# Patient Record
Sex: Male | Born: 1955 | Race: Black or African American | Hispanic: No | Marital: Single | Smoking: Never smoker
Health system: Southern US, Community
[De-identification: ages and names within clinical notes are randomized; demographics above are authoritative.]

## PROBLEM LIST (undated history)

## (undated) DIAGNOSIS — I10 Essential (primary) hypertension: Secondary | ICD-10-CM

## (undated) HISTORY — PX: BACK SURGERY: SHX140

## (undated) HISTORY — PX: LEG SURGERY: SHX1003

## (undated) HISTORY — DX: Essential (primary) hypertension: I10

---

## 1997-12-12 ENCOUNTER — Ambulatory Visit (HOSPITAL_BASED_OUTPATIENT_CLINIC_OR_DEPARTMENT_OTHER): Admission: RE | Admit: 1997-12-12 | Discharge: 1997-12-12 | Payer: Self-pay | Admitting: Orthopedic Surgery

## 2005-05-17 ENCOUNTER — Ambulatory Visit (HOSPITAL_COMMUNITY)
Admission: RE | Admit: 2005-05-17 | Discharge: 2005-05-17 | Payer: Self-pay | Admitting: Physical Medicine and Rehabilitation

## 2005-07-09 ENCOUNTER — Ambulatory Visit (HOSPITAL_COMMUNITY): Admission: RE | Admit: 2005-07-09 | Discharge: 2005-07-10 | Payer: Self-pay | Admitting: Orthopedic Surgery

## 2007-04-02 IMAGING — CR DG LUMBAR SPINE 2-3V
2 series · 2 of 2 positions shown · non-contrast
Comparison: none

CLINICAL DATA: L4-5 HNP.
 LUMBAR SPINE ? 2 VIEW:

[view not recorded (1 of 2)]
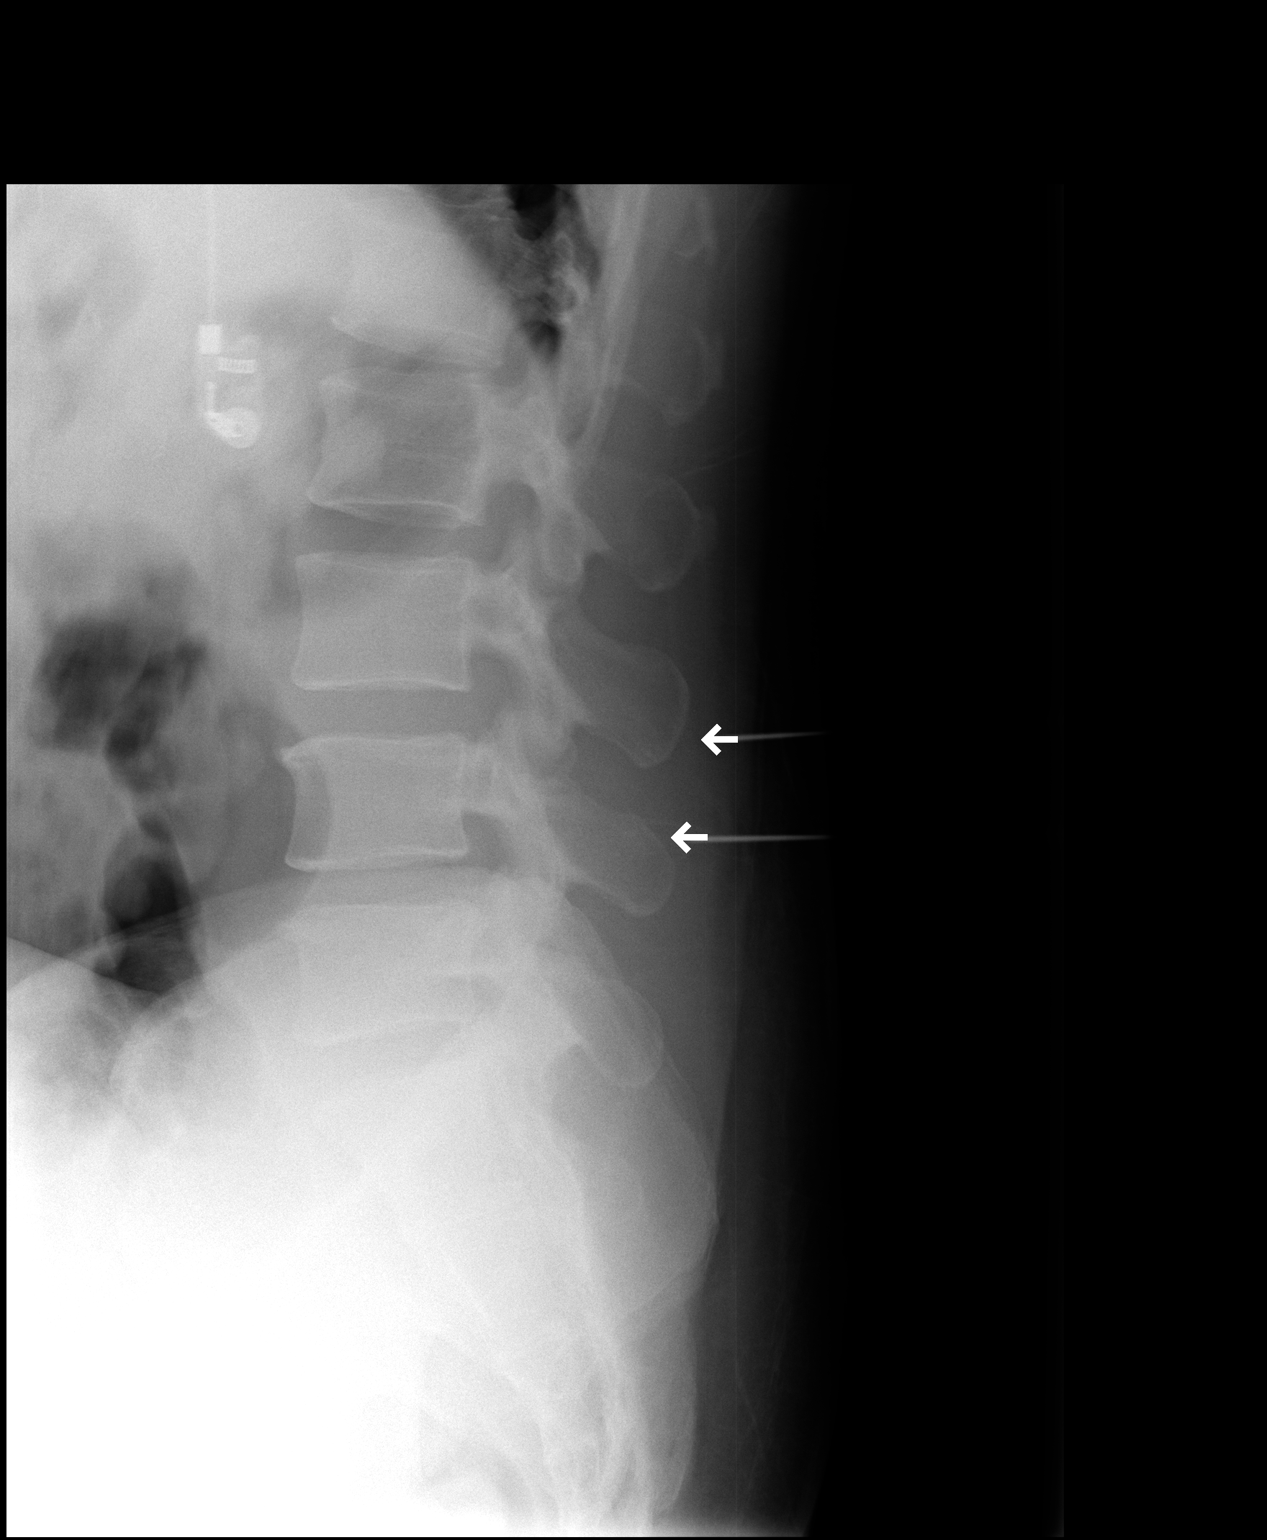

[view not recorded (2 of 2)]
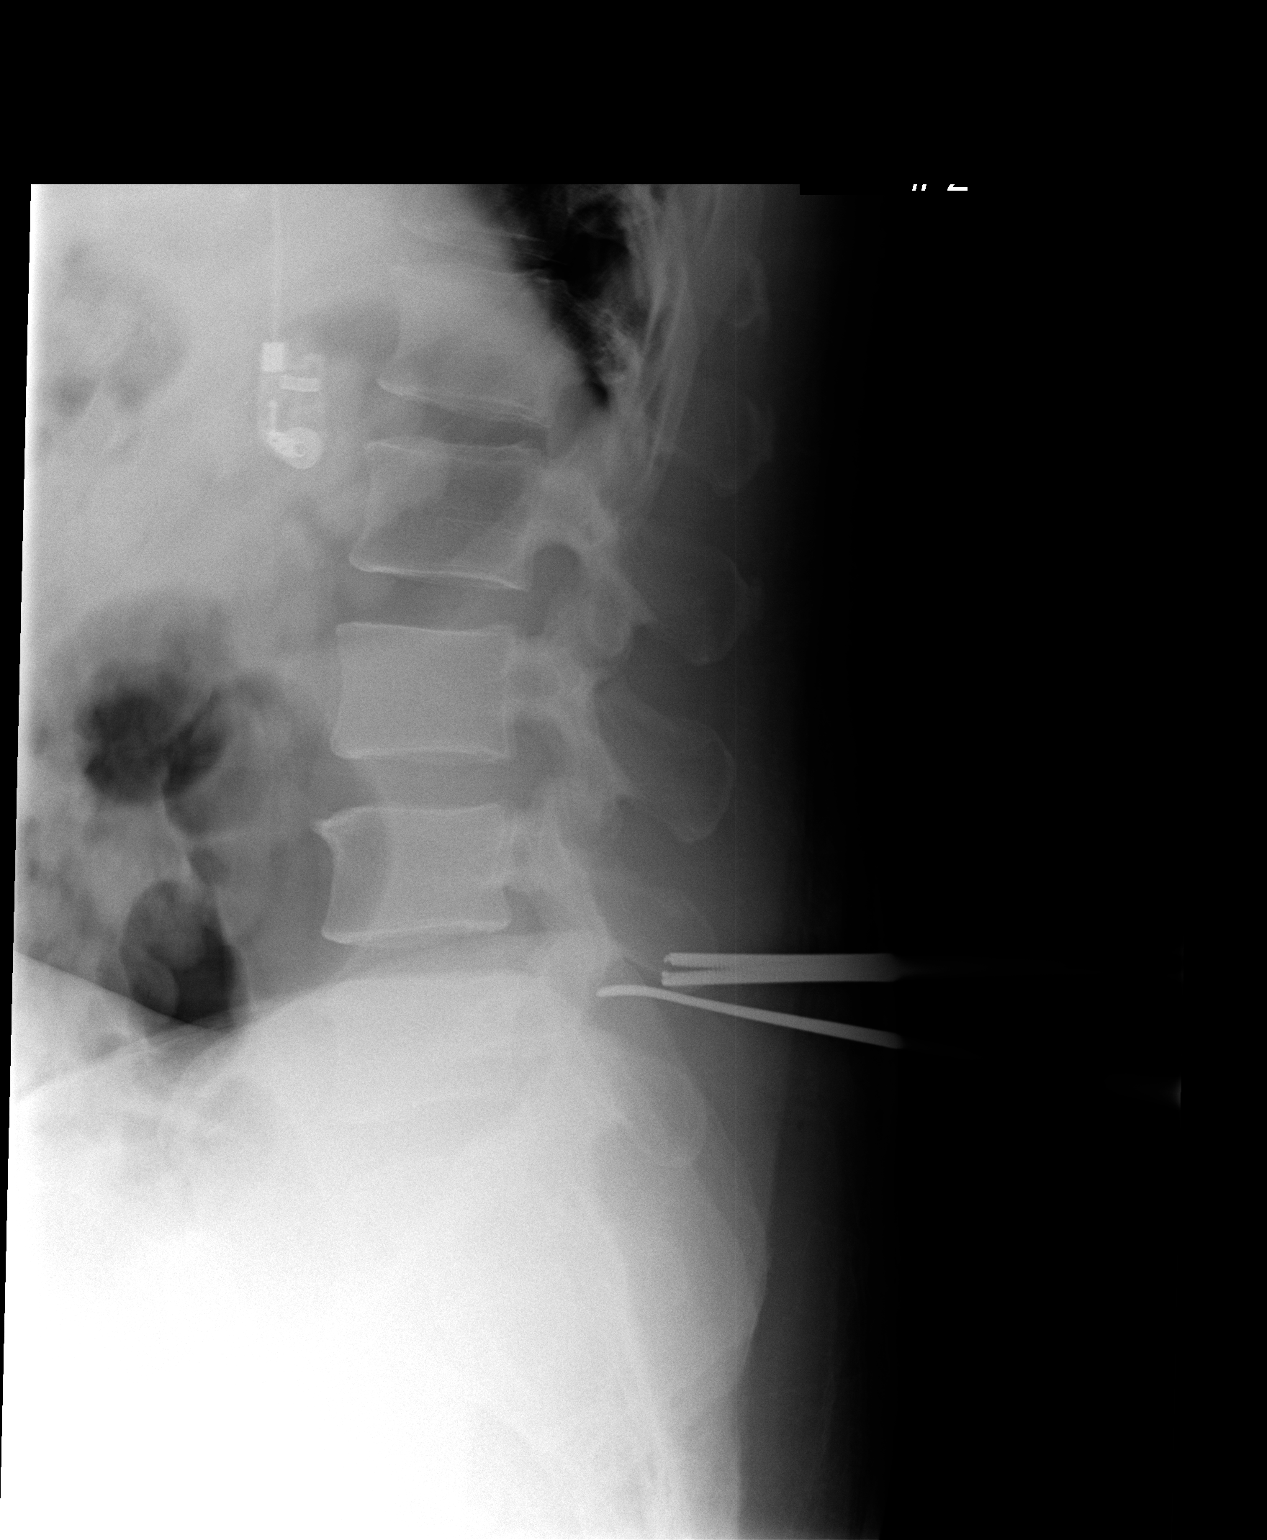

[2 of 2 positions shown; findings below may reference images not displayed]

FINDINGS: Two images obtained in the operating room.  
 Film #1 reveals needles directed at the spinous processes of L3 and L4.
 Film #2 reveals instruments directed towards the L4-5 interspaces with a clamp on the L4 spinous process.
IMPRESSION: L4-5 localized.

## 2009-06-11 ENCOUNTER — Emergency Department (HOSPITAL_COMMUNITY): Admission: EM | Admit: 2009-06-11 | Discharge: 2009-06-11 | Payer: Self-pay | Admitting: Emergency Medicine

## 2011-03-05 IMAGING — CR DG SHOULDER 2+V*R*
3 series · 3 of 3 positions shown · non-contrast
Comparison: None.

CLINICAL DATA: Right shoulder pain

RIGHT SHOULDER - 2+ VIEW

[w shoulder ap internal righ]
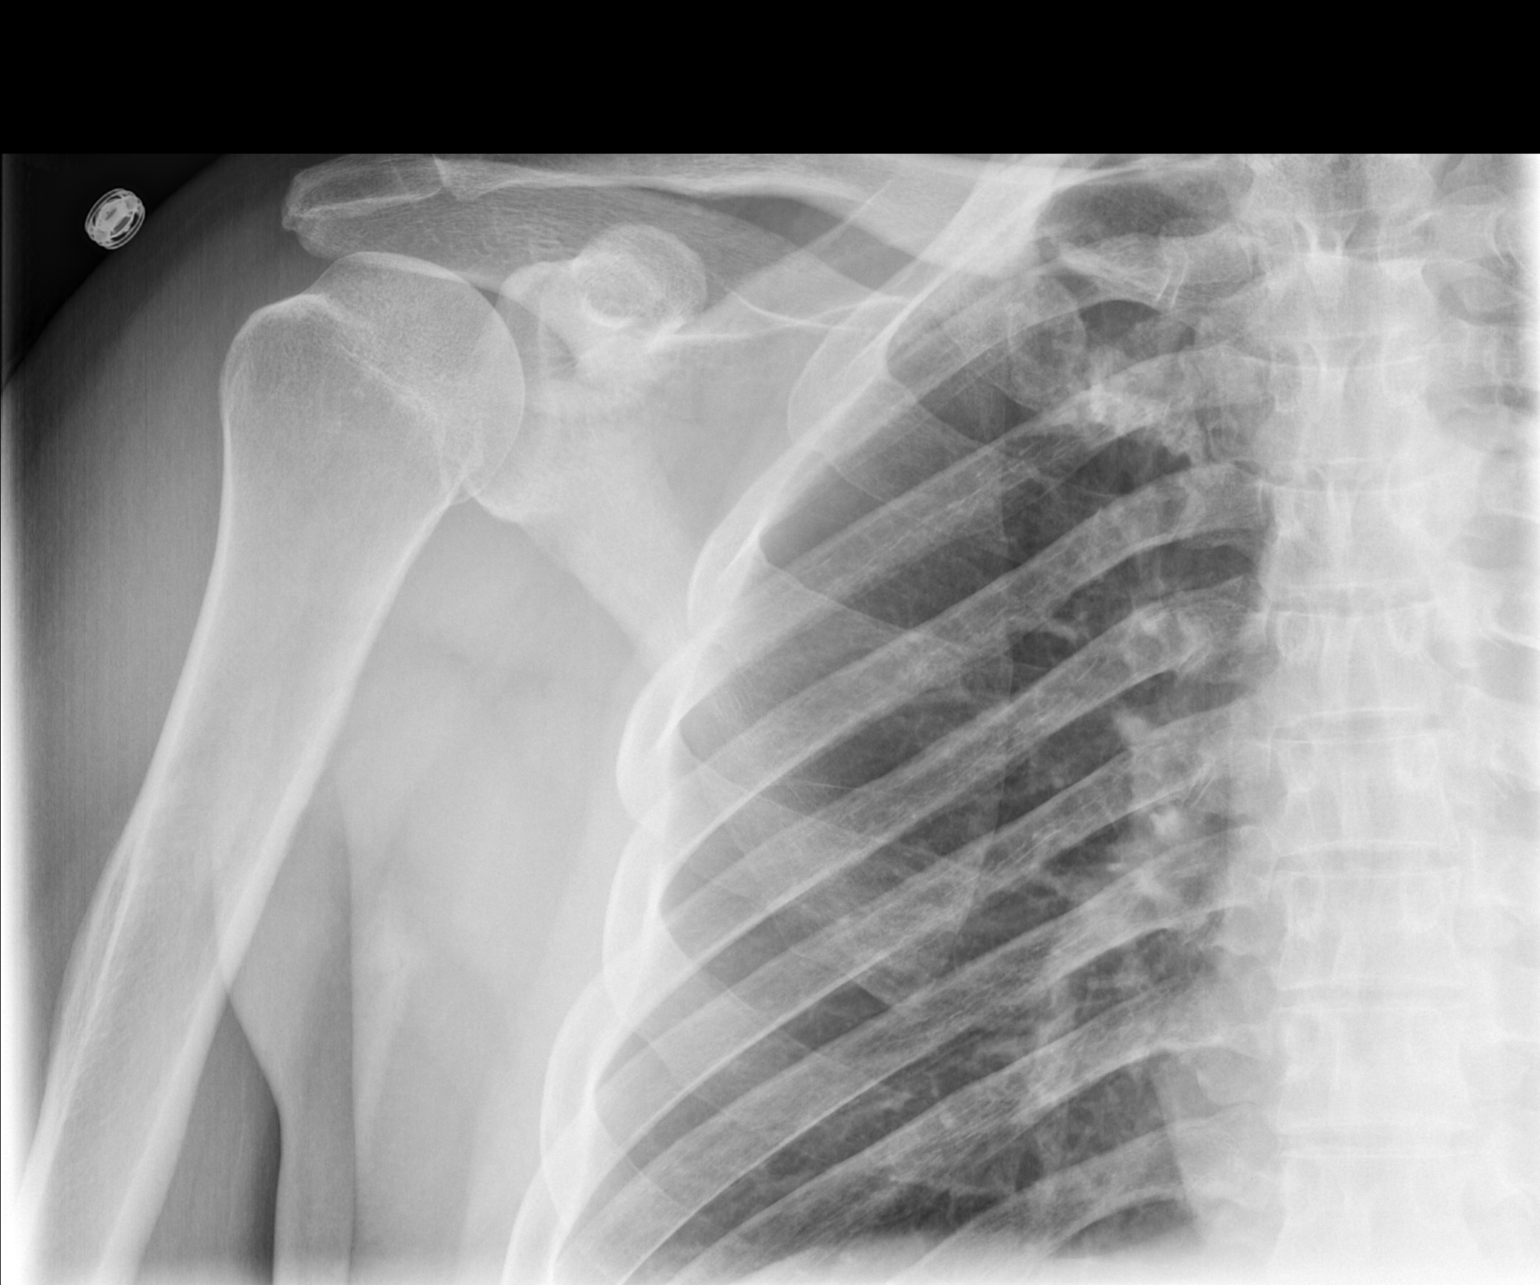

[w shoulder ap external righ]
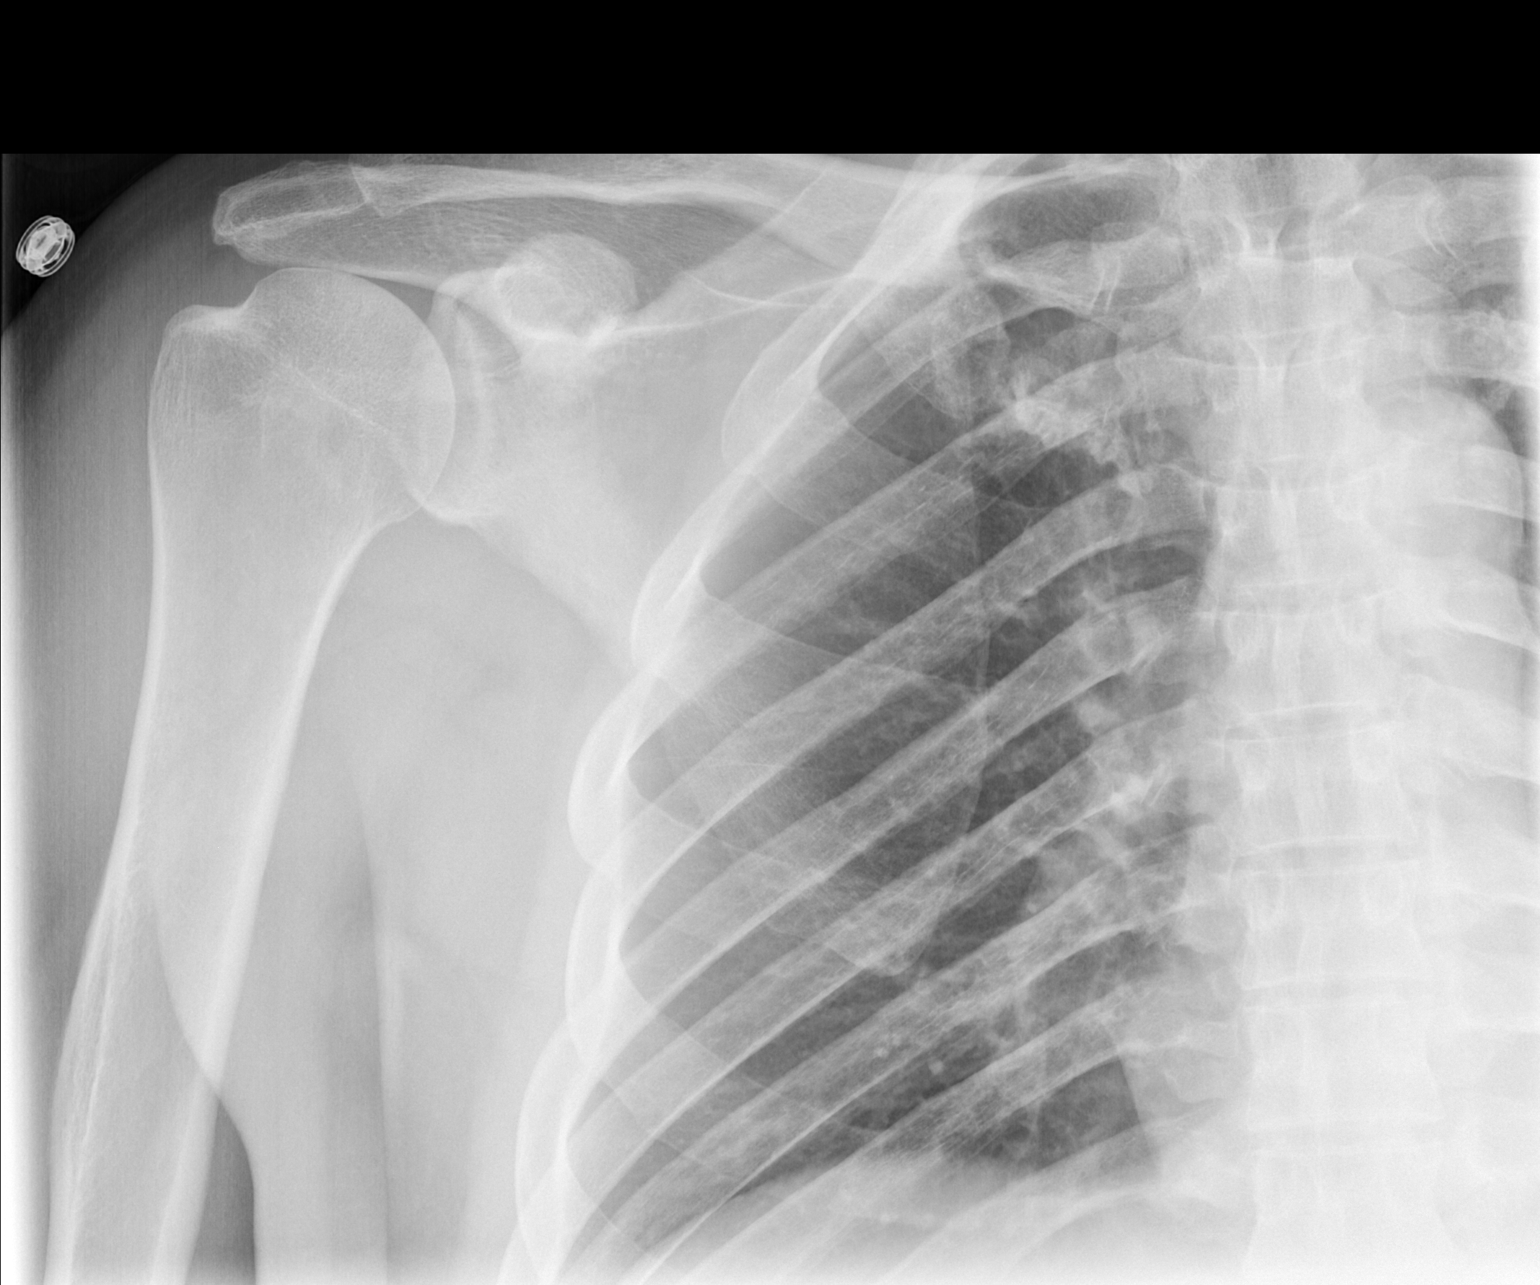

[w shoulder y view right]
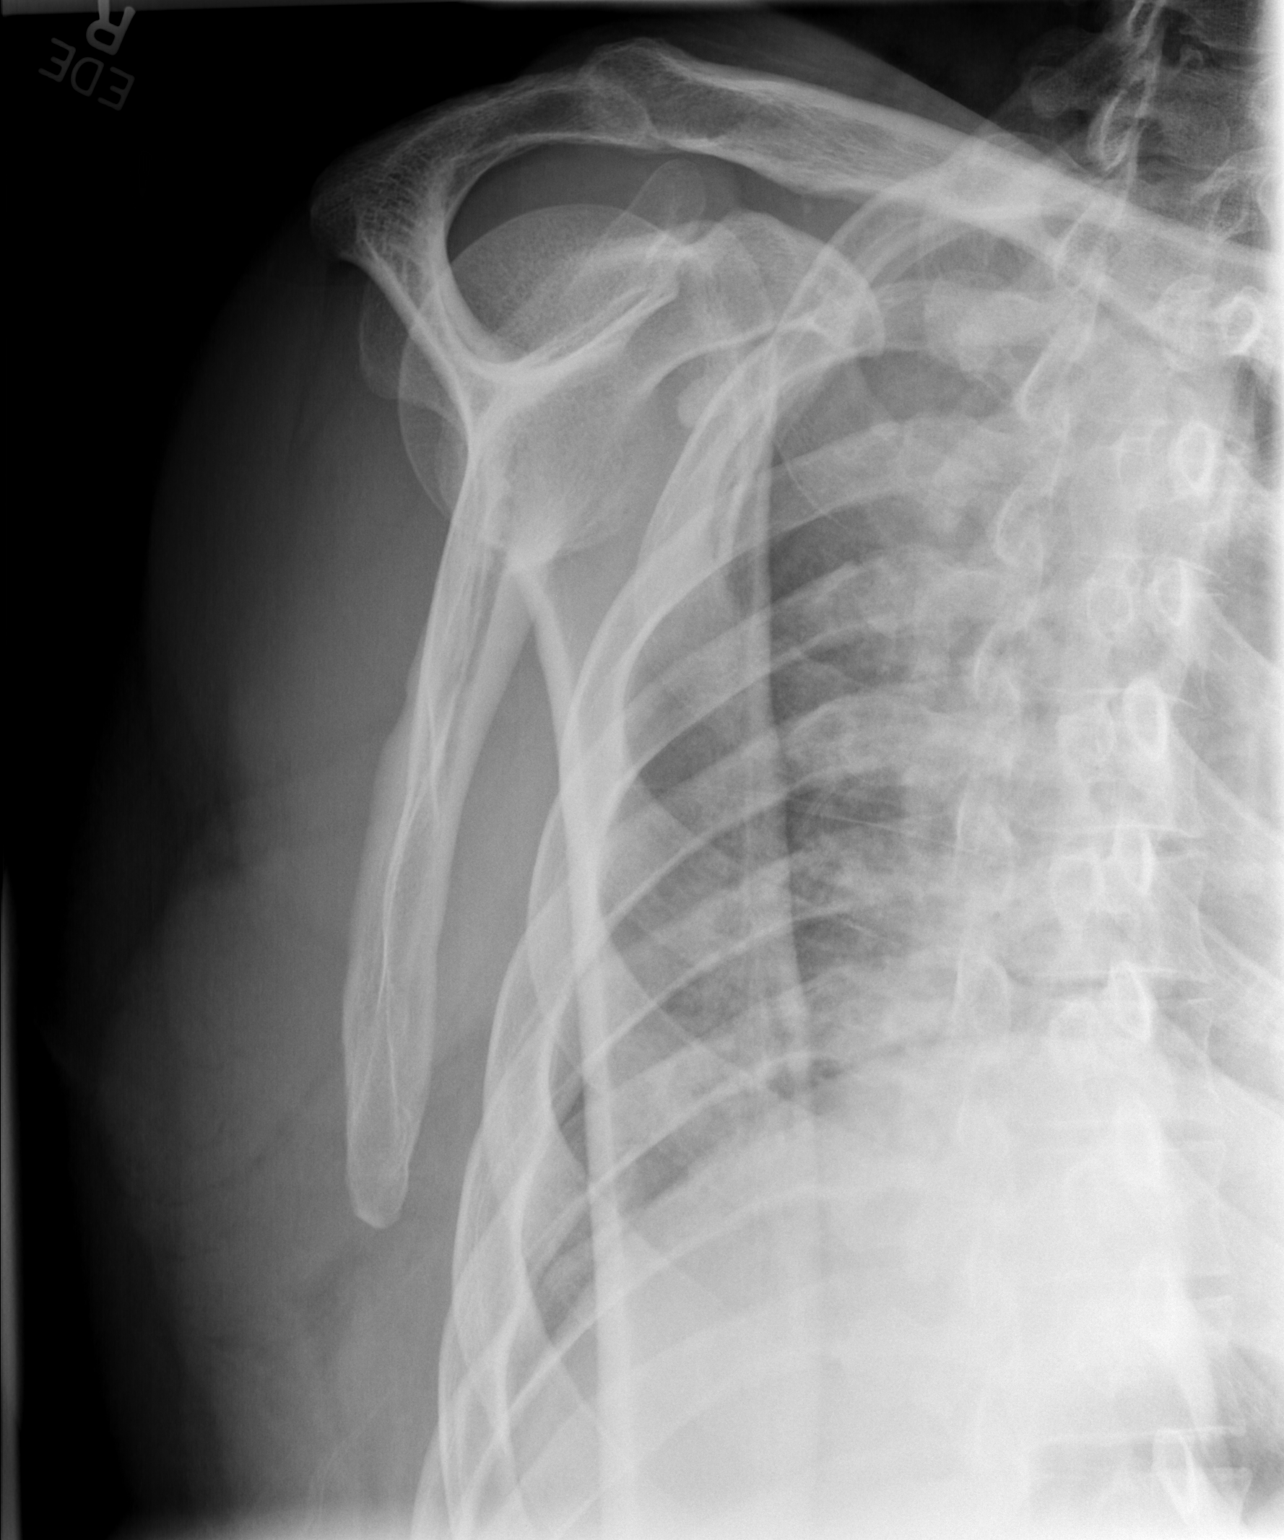

[3 of 3 positions shown; findings below may reference images not displayed]

FINDINGS: Three views of the right shoulder submitted.  No acute
fracture or subluxation.  Minimal degenerative changes right AC
joint with mild inferior spurring of the distal clavicle.
IMPRESSION: No acute fracture or subluxation.  Minimal degenerative changes
right AC joint.

## 2014-07-24 ENCOUNTER — Ambulatory Visit (INDEPENDENT_AMBULATORY_CARE_PROVIDER_SITE_OTHER): Payer: Self-pay | Admitting: Physician Assistant

## 2014-07-24 VITALS — BP 110/72 | HR 87 | Temp 97.9°F | Resp 18 | Ht 71.5 in | Wt 202.0 lb

## 2014-07-24 DIAGNOSIS — Z0289 Encounter for other administrative examinations: Secondary | ICD-10-CM
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
## 2015-04-22 ENCOUNTER — Ambulatory Visit (INDEPENDENT_AMBULATORY_CARE_PROVIDER_SITE_OTHER): Payer: 59 | Admitting: Family Medicine

## 2015-04-22 VITALS — BP 122/72 | HR 74 | Temp 98.6°F | Resp 17 | Ht 71.5 in | Wt 206.0 lb

## 2015-04-22 DIAGNOSIS — R1031 Right lower quadrant pain: Secondary | ICD-10-CM | POA: Diagnosis not present

## 2015-04-22 LAB — POCT CBC
GRANULOCYTE PERCENT: 60.2 % (ref 37–80)
HEMATOCRIT: 42.4 % — AB (ref 43.5–53.7)
HEMOGLOBIN: 14.4 g/dL (ref 14.1–18.1)
LYMPH, POC: 1.5 (ref 0.6–3.4)
MCH, POC: 27.2 pg (ref 27–31.2)
MCHC: 34 g/dL (ref 31.8–35.4)
MCV: 80.2 fL (ref 80–97)
MID (cbc): 0.2 (ref 0–0.9)
MPV: 6.7 fL (ref 0–99.8)
POC GRANULOCYTE: 2.5 (ref 2–6.9)
POC LYMPH PERCENT: 35.2 %L (ref 10–50)
POC MID %: 4.6 %M (ref 0–12)
Platelet Count, POC: 257 10*3/uL (ref 142–424)
RBC: 5.29 M/uL (ref 4.69–6.13)
RDW, POC: 12.7 %
WBC: 4.2 10*3/uL — AB (ref 4.6–10.2)

## 2015-04-22 LAB — COMPLETE METABOLIC PANEL WITH GFR
ALT: 23 U/L (ref 9–46)
AST: 26 U/L (ref 10–35)
Albumin: 3.7 g/dL (ref 3.6–5.1)
Alkaline Phosphatase: 83 U/L (ref 40–115)
BILIRUBIN TOTAL: 0.7 mg/dL (ref 0.2–1.2)
BUN: 19 mg/dL (ref 7–25)
CHLORIDE: 104 mmol/L (ref 98–110)
CO2: 26 mmol/L (ref 20–31)
Calcium: 9.7 mg/dL (ref 8.6–10.3)
Creat: 1.07 mg/dL (ref 0.70–1.33)
GFR, EST NON AFRICAN AMERICAN: 77 mL/min (ref >=60)
GFR, Est African American: 89 mL/min (ref >=60)
Glucose, Bld: 86 mg/dL (ref 65–99)
POTASSIUM: 4.2 mmol/L (ref 3.5–5.3)
SODIUM: 138 mmol/L (ref 135–146)
Total Protein: 7.5 g/dL (ref 6.1–8.1)

## 2015-04-22 LAB — POCT URINALYSIS DIP (MANUAL ENTRY)
Bilirubin, UA: NEGATIVE
GLUCOSE UA: NEGATIVE
Ketones, POC UA: NEGATIVE
Leukocytes, UA: NEGATIVE
NITRITE UA: NEGATIVE
PH UA: 6
Protein Ur, POC: NEGATIVE
RBC UA: NEGATIVE
Spec Grav, UA: 1.02
UROBILINOGEN UA: 1

## 2015-04-22 LAB — POC MICROSCOPIC URINALYSIS (UMFC): Mucus: ABSENT

## 2015-04-22 MED ORDER — IBUPROFEN 200 MG PO TABS
600.0000 mg | ORAL_TABLET | Freq: Once | ORAL | Status: AC
  Administered 2015-04-22: 600 mg via ORAL

## 2015-04-22 NOTE — Patient Instructions (Signed)
Please take ibuprofen over the counter, 2- 3 tablets every 8-12 hours for the next 2-3 days If you have worsening pain, vomiting that won't stop, or any difficulty urinating, please come back in to see [place] or go to the emergency room.

## 2015-04-22 NOTE — Progress Notes (Signed)
Subjective:    Patient ID: Gregory Mills, male    DOB: 05-22-1955, 60 y.o.   MRN: [id number]  HPI This is a pleasant 60 yo male who presents today with 6 days of right sided abdominal pain. Pain radiates to the right side of his back. Pain is 6/10 on presentation. Pain became worse yesterday when the patient pulled on a relief lever that was stuck and suddenly gave way.  Pain occasionally radiates to his right scotum.No pain or difficulty with urination. Patient has not tried any medication/heat/ice. Hot shower did help temporarily. Pain has not interfered with his normal activities or sleep. Patient denies SOB, chest pain, or n/v. Normal bm yesterday, has occasional constipation that he thinks is due to high consumption of nuts. Patient works out several times a week, lifting weights and doing core exercises. He exercised the day before pain started but does not recall doing anything out of the ordinary.    No past medical history on file. Past Surgical History  Procedure Laterality Date  . Leg surgery    . Back surgery     Family History  Problem Relation Age of Onset  . Heart disease Mother   . Stroke Mother   . Dementia Mother   . Hypertension Father   . Heart disease Father    Social History  Substance Use Topics  . Smoking status: Never Smoker   . Smokeless tobacco: Never Used  . Alcohol Use: No     Review of Systems  Constitutional: Negative for fever, chills and fatigue.  Respiratory: Negative for cough, chest tightness and shortness of breath.   Cardiovascular: Negative for chest pain and leg swelling.  Gastrointestinal: Positive for abdominal pain and constipation. Negative for nausea, vomiting, diarrhea, blood in stool and anal bleeding.  Genitourinary: Positive for flank pain (abdominal pain radiates to back) and testicular pain (intermittent, radiates from abdomen to right testicle.). Negative for dysuria, frequency, hematuria and penile pain.  Neurological:  Negative for dizziness and light-headedness.       Objective:   Physical Exam  Constitutional: He is oriented to person, place, and time. He appears well-developed and well-nourished.  HENT:  Head: Normocephalic and atraumatic.  Eyes: Conjunctivae are normal.  Neck: Normal range of motion.  Cardiovascular: Normal rate, regular rhythm and normal heart sounds.   Pulmonary/Chest: Effort normal and breath sounds normal.  Abdominal: Soft. Bowel sounds are normal. He exhibits no distension and no mass. There is tenderness in the right upper quadrant and right lower quadrant. There is CVA tenderness. There is no rigidity, no rebound, no guarding, no tenderness at McBurney's point and negative Murphy's sign. No hernia.  Pain is reproduced with abdominal flexion and lateral twisting to the left.   Genitourinary: Penis normal. Right testis shows tenderness (mildly tender, generalized). Right testis shows no mass and no swelling. Right testis is descended. Left testis shows no mass, no swelling and no tenderness. Left testis is descended.  Musculoskeletal: Normal range of motion.  Neurological: He is alert and oriented to person, place, and time.  Skin: Skin is warm.  Psychiatric: He has a normal mood and affect. His behavior is normal. Judgment and thought content normal.  Vitals reviewed.   BP 122/72 mmHg  Pulse 74  Temp(Src) 98.6 F (37 C) (Oral)  Resp 17  Ht 5' 11.5" (1.816 m)  Wt 206 lb (93.441 kg)  BMI 28.33 kg/m2  SpO2 96% Wt Readings from Last 3 Encounters:  04/22/15 206 lb (93.441 kg)  07/24/14 202 lb (91.627 kg)       Assessment & Plan:  1. Right lower quadrant abdominal pain - POCT CBC- unremarkable, discussed results with patient - COMPLETE METABOLIC PANEL WITH GFR - POCT urinalysis dipstick- unremarkable - POCT Microscopic Urinalysis ([facility])- umremarkable - ibuprofen (ADVIL,MOTRIN) tablet 600 mg; Take 3 tablets (600 mg total) by mouth once. - suspect muscle strain with  unremarkable POCT labs -  Patient Instructions  Please take ibuprofen over the counter, 2- 3 tablets every 8-12 hours for the next 2-3 days If you have worsening pain, vomiting that won't stop, or any difficulty urinating, please come back in to see [place] or go to the emergency room.    - Will follow up with him tomorrow with CMP results - CPE/ establish care  Olean Ree, FNP-BC  [facility]  04/23/2015 8:07 PM

## 2015-04-23 ENCOUNTER — Encounter: Payer: Self-pay | Admitting: Family Medicine

## 2015-04-25 ENCOUNTER — Telehealth: Payer: Self-pay | Admitting: Family Medicine

## 2015-04-25 NOTE — Telephone Encounter (Signed)
lmom for pt to call to set up an CPE with Debbie in 1-2 months

## 2015-04-28 ENCOUNTER — Telehealth: Payer: Self-pay

## 2015-04-28 NOTE — Telephone Encounter (Signed)
LMVM for patient to cb to schedule an appt with Deboraha Sprang.

## 2015-04-28 NOTE — Telephone Encounter (Signed)
-----   Message from Emi Belfast, FNP sent at 04/23/2015  7:40 PM EST ----- Please call patient to schedule him for CPE in 1-2 months.  Thank you,  Eunice Blase

## 2015-05-01 ENCOUNTER — Telehealth: Payer: Self-pay | Admitting: Family Medicine

## 2015-05-01 NOTE — Telephone Encounter (Signed)
-----   Message from Emi Belfast, FNP sent at 04/23/2015  7:40 PM EST ----- Please call patient to schedule him for CPE in 1-2 months.  Thank you,  Eunice Blase

## 2015-05-01 NOTE — Telephone Encounter (Signed)
LMOM to CB. 

## 2015-08-12 ENCOUNTER — Telehealth: Payer: Self-pay | Admitting: Cardiovascular Disease

## 2015-08-12 NOTE — Telephone Encounter (Signed)
[facility] appointment with Dr Allyson SabalBerry on 08/27/15.  Records given to [employer] (medical records) for Dr Hazle CocaBerry's schedule on 08/27/15. lp

## 2015-08-27 ENCOUNTER — Encounter: Payer: Self-pay | Admitting: Cardiovascular Disease

## 2015-08-27 ENCOUNTER — Other Ambulatory Visit: Payer: Self-pay | Admitting: *Deleted

## 2015-08-27 ENCOUNTER — Ambulatory Visit (INDEPENDENT_AMBULATORY_CARE_PROVIDER_SITE_OTHER): Payer: Commercial Managed Care - HMO | Admitting: Cardiovascular Disease

## 2015-08-27 VITALS — BP 108/76 | HR 67 | Ht 73.0 in | Wt 207.0 lb

## 2015-08-27 DIAGNOSIS — E785 Hyperlipidemia, unspecified: Secondary | ICD-10-CM | POA: Diagnosis not present

## 2015-08-27 DIAGNOSIS — R079 Chest pain, unspecified: Secondary | ICD-10-CM

## 2015-08-27 DIAGNOSIS — Z01818 Encounter for other preprocedural examination: Secondary | ICD-10-CM | POA: Diagnosis not present

## 2015-08-27 DIAGNOSIS — Z79899 Other long term (current) drug therapy: Secondary | ICD-10-CM | POA: Diagnosis not present

## 2015-08-27 DIAGNOSIS — I10 Essential (primary) hypertension: Secondary | ICD-10-CM

## 2015-08-27 HISTORY — DX: Essential (primary) hypertension: I10

## 2015-08-27 LAB — CBC WITH DIFFERENTIAL/PLATELET
BASOS ABS: 44 {cells}/uL (ref 0–200)
Basophils Relative: 1 %
EOS ABS: 44 {cells}/uL (ref 15–500)
Eosinophils Relative: 1 %
HEMATOCRIT: 42.9 % (ref 38.5–50.0)
HEMOGLOBIN: 14.3 g/dL (ref 13.2–17.1)
LYMPHS ABS: 1716 {cells}/uL (ref 850–3900)
LYMPHS PCT: 39 %
MCH: 26.7 pg — AB (ref 27.0–33.0)
MCHC: 33.3 g/dL (ref 32.0–36.0)
MCV: 80.2 fL (ref 80.0–100.0)
MONO ABS: 220 {cells}/uL (ref 200–950)
MPV: 9.9 fL (ref 7.5–12.5)
Monocytes Relative: 5 %
NEUTROS PCT: 54 %
Neutro Abs: 2376 cells/uL (ref 1500–7800)
Platelets: 219 10*3/uL (ref 140–400)
RBC: 5.35 MIL/uL (ref 4.20–5.80)
RDW: 14 % (ref 11.0–15.0)
WBC: 4.4 10*3/uL (ref 3.8–10.8)

## 2015-08-27 LAB — BASIC METABOLIC PANEL
BUN: 16 mg/dL (ref 7–25)
CO2: 22 mmol/L (ref 20–31)
CREATININE: 1.15 mg/dL (ref 0.70–1.33)
Calcium: 9.3 mg/dL (ref 8.6–10.3)
Chloride: 106 mmol/L (ref 98–110)
GLUCOSE: 90 mg/dL (ref 65–99)
POTASSIUM: 4.7 mmol/L (ref 3.5–5.3)
Sodium: 141 mmol/L (ref 135–146)

## 2015-08-27 LAB — TSH: TSH: 1.09 mIU/L (ref 0.40–4.50)

## 2015-08-27 MED ORDER — LOSARTAN POTASSIUM 50 MG PO TABS: 50.0000 mg | ORAL_TABLET | Freq: Every day | ORAL | Status: DC

## 2015-08-27 NOTE — Progress Notes (Signed)
08/27/2015 Gregory Mills   1955-12-15  [id number]  Primary Physician No PCP Per Patient Primary Cardiologist: Runell Gess MD Roseanne Reno   HPI:  Gregory Mills there is a 60 year old thin appearing engaged African-American male with no children and works as a Agricultural consultant. He was referred by Dr. Hanley Hays for cardiac catheterization. History of factors include hyperlipidemia and family history. He is complaining of increasing dyspnea on exertion and chest pain for the last year and had a recent Myoview stress test notable for inferior ischemia. He was referred here for cardiac catheterization.   Current Outpatient Prescriptions  Medication Sig Dispense Refill  . aspirin 81 MG tablet Take 81 mg by mouth daily.    . Cholecalciferol (VITAMIN D3) 2000 units TABS Take 2,000 Units by mouth.    . doxycycline (VIBRA-TABS) 100 MG tablet Take 100 mg by mouth 2 (two) times daily.    Marland Kitchen ketoconazole (NIZORAL) 2 % cream     . Multiple Vitamin (MULTIVITAMIN) tablet Take 1 tablet by mouth daily.    . nitroGLYCERIN (NITROSTAT) 0.4 MG SL tablet     . rosuvastatin (CRESTOR) 5 MG tablet Take 5 mg by mouth daily.    Marland Kitchen losartan (COZAAR) 50 MG tablet Take 1 tablet (50 mg total) by mouth daily. 90 tablet 3   No current facility-administered medications for this visit.    No Known Allergies  Social History   Social History  . Marital Status: Single    Spouse Name: N/A  . Number of Children: N/A  . Years of Education: N/A   Occupational History  . Not on file.   Social History Main Topics  . Smoking status: Never Smoker   . Smokeless tobacco: Never Used  . Alcohol Use: No  . Drug Use: No  . Sexual Activity: Not on file   Other Topics Concern  . Not on file   Social History Narrative     Review of Systems: General: negative for chills, fever, night sweats or weight changes.  Cardiovascular: negative for chest pain, dyspnea on exertion, edema, orthopnea,  palpitations, paroxysmal nocturnal dyspnea or shortness of breath Dermatological: negative for rash Respiratory: negative for cough or wheezing Urologic: negative for hematuria Abdominal: negative for nausea, vomiting, diarrhea, bright red blood per rectum, melena, or hematemesis Neurologic: negative for visual changes, syncope, or dizziness All other systems reviewed and are otherwise negative except as noted above.    Blood pressure 108/76, pulse 67, height  (1.854 m), weight 207 lb (93.895 kg).  General appearance: alert and no distress Neck: no adenopathy, no carotid bruit, no JVD, supple, symmetrical, trachea midline and thyroid not enlarged, symmetric, no tenderness/mass/nodules Lungs: clear to auscultation bilaterally Heart: regular rate and rhythm, S1, S2 normal, no murmur, click, rub or gallop Extremities: extremities normal, atraumatic, no cyanosis or edema  EKG normal sinus rhythm at 67 with lateral T-wave inversion. I personally reviewed this EKG  ASSESSMENT AND PLAN:   Essential hypertension History of hypertensive blood pressure measured at 108/76. He is on low-dose lisinopril and has a dry cough. I'm going to change him to losartan 50 mg a day  Hyperlipidemia History of hyperlipidemia on statin therapy followed by his PCP  Chest pain History of exertional chest pain and dyspnea with a positive Myoview stress test consistent with inferior ischemia. The patient was referred for cardiac catheterization. He does have a sector including family history, hypertension and hyperlipidemia.  I have reviewed the risks, indications, and alternatives  to cardiac catheterization, possible angioplasty, and stenting with the patient. Risks include but are not limited to bleeding, infection, vascular injury, stroke, myocardial infection, arrhythmia, kidney injury, radiation-related injury in the case of prolonged fluoroscopy use, emergency cardiac surgery, and death. The patient  understands the risks of serious complication is 1-2 in 1000 with diagnostic cardiac cath and 1-2% or less with angioplasty/stenting.       Runell GessJonathan J. Grayton Lobo MD FACP,FACC,FAHA, [facility] 08/27/2015 11:15 AM

## 2015-08-27 NOTE — Patient Instructions (Signed)
Medication Instructions:  Your physician has recommended you make the following change in your medication:  1- STOP TAKING YOUR LISINOPRIL 2- START TAKING Losartan 50 mg (1 tablet) by mouth daily.   Testing/Procedures: Your physician has requested that you have a cardiac catheterization. Cardiac catheterization is used to diagnose and/or treat various heart conditions. Doctors may recommend this procedure for a number of different reasons. The most common reason is to evaluate chest pain. Chest pain can be a symptom of coronary artery disease (CAD), and cardiac catheterization can show whether plaque is narrowing or blocking your heart's arteries. This procedure is also used to evaluate the valves, as well as measure the blood flow and oxygen levels in different parts of your heart. For further information please visit [email or web address].   Following your catheterization, you will not be allowed to drive for 3 days.  No lifting, pushing, or pulling greater that 10 pounds is allowed for 1 week.  You will be required to have the following tests prior to the procedure:  1. Blood work-the blood work can be done no more than 14 days prior to the procedure.  [facility] be done at [facility].  There is one downstairs on the first floor of this building and one in the [facility] building [phone]([address], [address]).  2. Chest Xray-the chest [facility].      Puncture site RIGHT WRIST   Any Other Special Instructions Will Be Listed Below (If Applicable).     If you need a refill on your cardiac medications before your next appointment, please call your pharmacy.

## 2015-08-27 NOTE — Assessment & Plan Note (Signed)
History of hyperlipidemia on statin therapy followed by his PCP 

## 2015-08-27 NOTE — Assessment & Plan Note (Signed)
History of exertional chest pain and dyspnea with a positive Myoview stress test consistent with inferior ischemia. The patient was referred for cardiac catheterization. He does have a sector including family history, hypertension and hyperlipidemia.  I have reviewed the risks, indications, and alternatives to cardiac catheterization, possible angioplasty, and stenting with the patient. Risks include but are not limited to bleeding, infection, vascular injury, stroke, myocardial infection, arrhythmia, kidney injury, radiation-related injury in the case of prolonged fluoroscopy use, emergency cardiac surgery, and death. The patient understands the risks of serious complication is 1-2 in 1000 with diagnostic cardiac cath and 1-2% or less with angioplasty/stenting.

## 2015-08-27 NOTE — Assessment & Plan Note (Signed)
History of hypertensive blood pressure measured at 108/76. He is on low-dose lisinopril and has a dry cough. I'm going to change him to losartan 50 mg a day

## 2015-08-28 ENCOUNTER — Telehealth: Payer: Self-pay | Admitting: Cardiovascular Disease

## 2015-08-28 LAB — PROTIME-INR
INR: 1.01 (ref <1.50)
Prothrombin Time: 13.4 seconds (ref 11.6–15.2)

## 2015-08-28 LAB — APTT: APTT: 30 s (ref 24–37)

## 2015-08-28 NOTE — Telephone Encounter (Signed)
New message    Patient calling asking for sooner procedure appt than 6/19

## 2015-08-28 NOTE — Telephone Encounter (Signed)
Called Cath lab and rescheduled heart cath for September 01, 2015 at 1200 and pt needs to arrive at 1000.  Called pt back and gave him the insstructions to be at Main entrance of [facility] at 10:00 a.m on 09/01/15 for his procedure at 12:00. He verbalized understanding.

## 2015-08-28 NOTE — Telephone Encounter (Signed)
Returned call to pt. He is very eager to have the procedure because he wants to know what is going on with his heart. Explained that Dr Allyson SabalBerry is only in the procedural area on certain days, but will check to see if we can move the heart cath up. Will return call to patient with update.

## 2015-09-01 ENCOUNTER — Encounter (HOSPITAL_COMMUNITY)
Admission: RE | Disposition: A | Discharge status: Home / Self Care | Payer: Self-pay | Source: Ambulatory Visit | Attending: Cardiovascular Disease

## 2015-09-01 ENCOUNTER — Ambulatory Visit (HOSPITAL_COMMUNITY)
Admission: RE | Admit: 2015-09-01 | Discharge: 2015-09-01 | Disposition: A | Discharge status: Home / Self Care | Payer: Commercial Managed Care - HMO | Source: Ambulatory Visit | Attending: Cardiovascular Disease | Admitting: Cardiovascular Disease

## 2015-09-01 ENCOUNTER — Encounter (HOSPITAL_COMMUNITY): Payer: Self-pay | Admitting: Cardiovascular Disease

## 2015-09-01 DIAGNOSIS — I209 Angina pectoris, unspecified: Secondary | ICD-10-CM

## 2015-09-01 DIAGNOSIS — Z7982 Long term (current) use of aspirin: Secondary | ICD-10-CM | POA: Diagnosis not present

## 2015-09-01 DIAGNOSIS — R079 Chest pain, unspecified: Secondary | ICD-10-CM | POA: Insufficient documentation

## 2015-09-01 DIAGNOSIS — R0609 Other forms of dyspnea: Secondary | ICD-10-CM | POA: Insufficient documentation

## 2015-09-01 DIAGNOSIS — Z79899 Other long term (current) drug therapy: Secondary | ICD-10-CM | POA: Diagnosis not present

## 2015-09-01 DIAGNOSIS — I10 Essential (primary) hypertension: Secondary | ICD-10-CM | POA: Insufficient documentation

## 2015-09-01 DIAGNOSIS — E785 Hyperlipidemia, unspecified: Secondary | ICD-10-CM | POA: Insufficient documentation

## 2015-09-01 HISTORY — PX: CARDIAC CATHETERIZATION: SHX172

## 2015-09-01 SURGERY — LEFT HEART CATH AND CORONARY ANGIOGRAPHY

## 2015-09-01 MED ORDER — SODIUM CHLORIDE 0.9 % WEIGHT BASED INFUSION: 3.0000 mL/kg/h | INTRAVENOUS | Status: DC

## 2015-09-01 MED ORDER — VERAPAMIL HCL 2.5 MG/ML IV SOLN
INTRAVENOUS | Status: AC
  Filled 2015-09-01: qty 2

## 2015-09-01 MED ORDER — LIDOCAINE HCL (PF) 1 % IJ SOLN
INTRAMUSCULAR | Status: AC
  Filled 2015-09-01: qty 30

## 2015-09-01 MED ORDER — HEPARIN (PORCINE) IN NACL 2-0.9 UNIT/ML-% IJ SOLN
INTRAMUSCULAR | Status: AC
  Filled 2015-09-01: qty 1000

## 2015-09-01 MED ORDER — FENTANYL CITRATE (PF) 100 MCG/2ML IJ SOLN
INTRAMUSCULAR | Status: AC
  Filled 2015-09-01: qty 2

## 2015-09-01 MED ORDER — ONDANSETRON HCL 4 MG/2ML IJ SOLN: 4.0000 mg | Freq: Four times a day (QID) | INTRAMUSCULAR | Status: DC | PRN

## 2015-09-01 MED ORDER — SODIUM CHLORIDE 0.9% FLUSH: 3.0000 mL | INTRAVENOUS | Status: DC | PRN

## 2015-09-01 MED ORDER — ASPIRIN 81 MG PO CHEW: 81.0000 mg | CHEWABLE_TABLET | ORAL | Status: DC

## 2015-09-01 MED ORDER — MIDAZOLAM HCL 2 MG/2ML IJ SOLN
INTRAMUSCULAR | Status: AC
  Filled 2015-09-01: qty 2

## 2015-09-01 MED ORDER — SODIUM CHLORIDE 0.9 % WEIGHT BASED INFUSION
3.0000 mL/kg/h | INTRAVENOUS | Status: DC
  Administered 2015-09-01: 3 mL/kg/h via INTRAVENOUS

## 2015-09-01 MED ORDER — MORPHINE SULFATE (PF) 2 MG/ML IV SOLN: 2.0000 mg | INTRAVENOUS | Status: DC | PRN

## 2015-09-01 MED ORDER — HEPARIN SODIUM (PORCINE) 1000 UNIT/ML IJ SOLN
INTRAMUSCULAR | Status: AC
  Filled 2015-09-01: qty 1

## 2015-09-01 MED ORDER — LIDOCAINE HCL (PF) 1 % IJ SOLN
INTRAMUSCULAR | Status: DC | PRN
  Administered 2015-09-01: 2 mL via SUBCUTANEOUS

## 2015-09-01 MED ORDER — HEPARIN (PORCINE) IN NACL 2-0.9 UNIT/ML-% IJ SOLN
INTRAMUSCULAR | Status: DC | PRN
  Administered 2015-09-01: 11:00:00

## 2015-09-01 MED ORDER — SODIUM CHLORIDE 0.9 % WEIGHT BASED INFUSION: 1.0000 mL/kg/h | INTRAVENOUS | Status: DC

## 2015-09-01 MED ORDER — FENTANYL CITRATE (PF) 100 MCG/2ML IJ SOLN
INTRAMUSCULAR | Status: DC | PRN
Start: 2015-09-01 — End: 2015-09-01
  Administered 2015-09-01: 25 ug via INTRAVENOUS

## 2015-09-01 MED ORDER — SODIUM CHLORIDE 0.9% FLUSH: 3.0000 mL | Freq: Two times a day (BID) | INTRAVENOUS | Status: DC

## 2015-09-01 MED ORDER — VERAPAMIL HCL 2.5 MG/ML IV SOLN
INTRA_ARTERIAL | Status: DC | PRN
  Administered 2015-09-01: 4 mL via INTRA_ARTERIAL

## 2015-09-01 MED ORDER — HEPARIN SODIUM (PORCINE) 1000 UNIT/ML IJ SOLN
INTRAMUSCULAR | Status: DC | PRN
  Administered 2015-09-01: 5000 [IU] via INTRAVENOUS

## 2015-09-01 MED ORDER — MIDAZOLAM HCL 2 MG/2ML IJ SOLN
INTRAMUSCULAR | Status: DC | PRN
  Administered 2015-09-01: 1 mg via INTRAVENOUS

## 2015-09-01 MED ORDER — ACETAMINOPHEN 325 MG PO TABS: 650.0000 mg | ORAL_TABLET | ORAL | Status: DC | PRN

## 2015-09-01 MED ORDER — SODIUM CHLORIDE 0.9 % IV SOLN: 250.0000 mL | INTRAVENOUS | Status: DC | PRN

## 2015-09-01 MED ORDER — IOPAMIDOL (ISOVUE-370) INJECTION 76%
INTRAVENOUS | Status: DC | PRN
  Administered 2015-09-01: 70 mL

## 2015-09-01 MED ORDER — NITROGLYCERIN 1 MG/10 ML FOR IR/CATH LAB
INTRA_ARTERIAL | Status: AC
  Filled 2015-09-01: qty 10

## 2015-09-01 MED ORDER — ASPIRIN 81 MG PO CHEW
81.0000 mg | CHEWABLE_TABLET | Freq: Every day | ORAL | Status: DC
Start: 2015-09-02 — End: 2015-09-01

## 2015-09-01 SURGICAL SUPPLY — 11 items
CATH INFINITI 5FR ANG PIGTAIL (CATHETERS) ×2 IMPLANT
CATH OPTITORQUE TIG 4.0 5F (CATHETERS) ×2 IMPLANT
DEVICE RAD COMP TR BAND LRG (VASCULAR PRODUCTS) ×2 IMPLANT
GLIDESHEATH SLEND A-KIT 6F 22G (SHEATH) ×2 IMPLANT
KIT HEART LEFT (KITS) ×3 IMPLANT
PACK CARDIAC CATHETERIZATION (CUSTOM PROCEDURE TRAY) ×3 IMPLANT
SYR MEDRAD MARK V 150ML (SYRINGE) ×3 IMPLANT
TRANSDUCER W/STOPCOCK (MISCELLANEOUS) ×3 IMPLANT
TUBING CIL FLEX 10 FLL-RA (TUBING) ×3 IMPLANT
WIRE HI TORQ VERSACORE-J 145CM (WIRE) ×2 IMPLANT
WIRE SAFE-T 1.5MM-J .035X260CM (WIRE) ×2 IMPLANT

## 2015-09-01 NOTE — H&P (View-Only) (Signed)
   08/27/2015 Gregory Mills   10/19/1955  [id number]  Primary Physician No PCP Per Patient Primary Cardiologist: Gregory J. Berry MD FACP,FACC,FAHA, FSCAI   HPI:  [name] there is a 59-year-old thin appearing engaged African-American male with no children and works as a long-distance truck driver. He was referred by Dr. Rosario for cardiac catheterization. History of factors include hyperlipidemia and family history. He is complaining of increasing dyspnea on exertion and chest pain for the last year and had a recent Myoview stress test notable for inferior ischemia. He was referred here for cardiac catheterization.   Current Outpatient Prescriptions  Medication Sig Dispense Refill  . aspirin 81 MG tablet Take 81 mg by mouth daily.    . Cholecalciferol (VITAMIN D3) 2000 units TABS Take 2,000 Units by mouth.    . doxycycline (VIBRA-TABS) 100 MG tablet Take 100 mg by mouth 2 (two) times daily.    . ketoconazole (NIZORAL) 2 % cream     . Multiple Vitamin (MULTIVITAMIN) tablet Take 1 tablet by mouth daily.    . nitroGLYCERIN (NITROSTAT) 0.4 MG SL tablet     . rosuvastatin (CRESTOR) 5 MG tablet Take 5 mg by mouth daily.    . losartan (COZAAR) 50 MG tablet Take 1 tablet (50 mg total) by mouth daily. 90 tablet 3   No current facility-administered medications for this visit.    No Known Allergies  Social History   Social History  . Marital Status: Single    Spouse Name: N/A  . Number of Children: N/A  . Years of Education: N/A   Occupational History  . Not on file.   Social History Main Topics  . Smoking status: Never Smoker   . Smokeless tobacco: Never Used  . Alcohol Use: No  . Drug Use: No  . Sexual Activity: Not on file   Other Topics Concern  . Not on file   Social History Narrative     Review of Systems: General: negative for chills, fever, night sweats or weight changes.  Cardiovascular: negative for chest pain, dyspnea on exertion, edema, orthopnea,  palpitations, paroxysmal nocturnal dyspnea or shortness of breath Dermatological: negative for rash Respiratory: negative for cough or wheezing Urologic: negative for hematuria Abdominal: negative for nausea, vomiting, diarrhea, bright red blood per rectum, melena, or hematemesis Neurologic: negative for visual changes, syncope, or dizziness All other systems reviewed and are otherwise negative except as noted above.    Blood pressure 108/76, pulse 67, height 6' 1" (1.854 m), weight 207 lb (93.895 kg).  General appearance: alert and no distress Neck: no adenopathy, no carotid bruit, no JVD, supple, symmetrical, trachea midline and thyroid not enlarged, symmetric, no tenderness/mass/nodules Lungs: clear to auscultation bilaterally Heart: regular rate and rhythm, S1, S2 normal, no murmur, click, rub or gallop Extremities: extremities normal, atraumatic, no cyanosis or edema  EKG normal sinus rhythm at 67 with lateral T-wave inversion. I personally reviewed this EKG  ASSESSMENT AND PLAN:   Essential hypertension History of hypertensive blood pressure measured at 108/76. He is on low-dose lisinopril and has a dry cough. I'm going to change him to losartan 50 mg a day  Hyperlipidemia History of hyperlipidemia on statin therapy followed by his PCP  Chest pain History of exertional chest pain and dyspnea with a positive Myoview stress test consistent with inferior ischemia. The patient was referred for cardiac catheterization. He does have a sector including family history, hypertension and hyperlipidemia.  I have reviewed the risks, indications, and alternatives   to cardiac catheterization, possible angioplasty, and stenting with the patient. Risks include but are not limited to bleeding, infection, vascular injury, stroke, myocardial infection, arrhythmia, kidney injury, radiation-related injury in the case of prolonged fluoroscopy use, emergency cardiac surgery, and death. The patient  understands the risks of serious complication is 1-2 in 1000 with diagnostic cardiac cath and 1-2% or less with angioplasty/stenting.       Runell GessJonathan J. Berry MD FACP,FACC,FAHA, [facility] 08/27/2015 11:15 AM

## 2015-09-01 NOTE — Interval H&P Note (Signed)
Cath Lab Visit (complete for each Cath Lab visit)  Clinical Evaluation Leading to the Procedure:   ACS: No.  Non-ACS:    Anginal Classification: CCS III  Anti-ischemic medical therapy: No Therapy  Non-Invasive Test Results: High-risk stress test findings: cardiac mortality >3%/year  Prior CABG: No previous CABG      History and Physical Interval Note:  09/01/2015 11:12 AM  Gregory Mills  has presented today for surgery, with the diagnosis of cp  The various methods of treatment have been discussed with the patient and family. After consideration of risks, benefits and other options for treatment, the patient has consented to  Procedure(s): Left Heart Cath and Coronary Angiography (N/A) as a surgical intervention .  The patient's history has been reviewed, patient examined, no change in status, stable for surgery.  I have reviewed the patient's chart and labs.  Questions were answered to the patient's satisfaction.     Nanetta BattyBerry, Nyron Mozer

## 2015-09-01 NOTE — Discharge Instructions (Signed)
Radial Site Care °Refer to this sheet in the next few weeks. These instructions provide you with information about caring for yourself after your procedure. Your health care provider may also give you more specific instructions. Your treatment has been planned according to current medical practices, but problems sometimes occur. Call your health care provider if you have any problems or questions after your procedure. °WHAT TO EXPECT AFTER THE PROCEDURE °After your procedure, it is typical to have the following: °· Bruising at the radial site that usually fades within 1-2 weeks. °· Blood collecting in the tissue (hematoma) that may be painful to the touch. It should usually decrease in size and tenderness within 1-2 weeks. °HOME CARE INSTRUCTIONS °· Take medicines only as directed by your health care provider. °· You may shower 24-48 hours after the procedure or as directed by your health care provider. Remove the bandage (dressing) and gently wash the site with plain soap and water. Pat the area dry with a clean towel. Do not rub the site, because this may cause bleeding. °· Do not take baths, swim, or use a hot tub until your health care provider approves. °· Check your insertion site every day for redness, swelling, or drainage. °· Do not apply powder or lotion to the site. °· Do not flex or bend the affected arm for 24 hours or as directed by your health care provider. °· Do not push or pull heavy objects with the affected arm for 24 hours or as directed by your health care provider. °· Do not lift over 10 lb (4.5 kg) for 5 days after your procedure or as directed by your health care provider. °· Ask your health care provider when it is okay to: °¨ Return to work or school. °¨ Resume usual physical activities or sports. °¨ Resume sexual activity. °· Do not drive home if you are discharged the same day as the procedure. Have someone else drive you. °· You may drive 24 hours after the procedure unless otherwise  instructed by your health care provider. °· Do not operate machinery or power tools for 24 hours after the procedure. °· If your procedure was done as an outpatient procedure, which means that you went home the same day as your procedure, a responsible adult should be with you for the first 24 hours after you arrive home. °· Keep all follow-up visits as directed by your health care provider. This is important. °SEEK MEDICAL CARE IF: °· You have a fever. °· You have chills. °· You have increased bleeding from the radial site. Hold pressure on the site. °SEEK IMMEDIATE MEDICAL CARE IF: °· You have unusual pain at the radial site. °· You have redness, warmth, or swelling at the radial site. °· You have drainage (other than a small amount of blood on the dressing) from the radial site. °· The radial site is bleeding, and the bleeding does not stop after 30 minutes of holding steady pressure on the site. °· Your arm or hand becomes pale, cool, tingly, or numb. °  °This information is not intended to replace advice given to you by your health care provider. Make sure you discuss any questions you have with your health care provider. °  °Document Released: 04/10/2010 Document Revised: 03/29/2014 Document Reviewed: 09/24/2013 °Elsevier Interactive Patient Education ©2016 [employer] ° °

## 2016-03-08 ENCOUNTER — Encounter (HOSPITAL_BASED_OUTPATIENT_CLINIC_OR_DEPARTMENT_OTHER): Payer: Self-pay | Admitting: *Deleted

## 2016-03-08 ENCOUNTER — Other Ambulatory Visit (HOSPITAL_BASED_OUTPATIENT_CLINIC_OR_DEPARTMENT_OTHER): Payer: Self-pay | Admitting: Radiology

## 2016-03-08 ENCOUNTER — Emergency Department (HOSPITAL_BASED_OUTPATIENT_CLINIC_OR_DEPARTMENT_OTHER)
Admission: EM | Admit: 2016-03-08 | Discharge: 2016-03-09 | Disposition: A | Discharge status: Home / Self Care | Payer: No Typology Code available for payment source | Attending: Emergency Medicine | Admitting: Emergency Medicine

## 2016-03-08 DIAGNOSIS — S80211A Abrasion, right knee, initial encounter: Secondary | ICD-10-CM | POA: Diagnosis not present

## 2016-03-08 DIAGNOSIS — Y92411 Interstate highway as the place of occurrence of the external cause: Secondary | ICD-10-CM | POA: Insufficient documentation

## 2016-03-08 DIAGNOSIS — Y9389 Activity, other specified: Secondary | ICD-10-CM | POA: Diagnosis not present

## 2016-03-08 DIAGNOSIS — Z7982 Long term (current) use of aspirin: Secondary | ICD-10-CM | POA: Diagnosis not present

## 2016-03-08 DIAGNOSIS — Z91040 Latex allergy status: Secondary | ICD-10-CM | POA: Diagnosis not present

## 2016-03-08 DIAGNOSIS — Z79899 Other long term (current) drug therapy: Secondary | ICD-10-CM | POA: Diagnosis not present

## 2016-03-08 DIAGNOSIS — M545 Low back pain: Secondary | ICD-10-CM | POA: Insufficient documentation

## 2016-03-08 DIAGNOSIS — R51 Headache: Secondary | ICD-10-CM | POA: Insufficient documentation

## 2016-03-08 DIAGNOSIS — Y999 Unspecified external cause status: Secondary | ICD-10-CM | POA: Insufficient documentation

## 2016-03-08 DIAGNOSIS — S8991XA Unspecified injury of right lower leg, initial encounter: Secondary | ICD-10-CM | POA: Diagnosis present

## 2016-03-08 DIAGNOSIS — I10 Essential (primary) hypertension: Secondary | ICD-10-CM | POA: Insufficient documentation

## 2016-03-08 MED ORDER — MORPHINE SULFATE (PF) 4 MG/ML IV SOLN
6.0000 mg | Freq: Once | INTRAVENOUS | Status: AC
  Administered 2016-03-09: 6 mg via INTRAVENOUS
  Filled 2016-03-08: qty 2

## 2016-03-08 NOTE — ED Notes (Signed)
md cleared pt from [facility]
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
## 2016-03-09 ENCOUNTER — Emergency Department (HOSPITAL_BASED_OUTPATIENT_CLINIC_OR_DEPARTMENT_OTHER): Payer: No Typology Code available for payment source

## 2016-03-09 ENCOUNTER — Other Ambulatory Visit (HOSPITAL_BASED_OUTPATIENT_CLINIC_OR_DEPARTMENT_OTHER): Payer: Self-pay | Admitting: Radiology

## 2016-03-09 LAB — CBC WITH DIFFERENTIAL/PLATELET
BASOS ABS: 0 10*3/uL (ref 0.0–0.1)
Basophils Relative: 1 %
Eosinophils Absolute: 0.1 10*3/uL (ref 0.0–0.7)
Eosinophils Relative: 2 %
HEMATOCRIT: 44.7 % (ref 39.0–52.0)
HEMOGLOBIN: 15.2 g/dL (ref 13.0–17.0)
LYMPHS PCT: 44 %
Lymphs Abs: 2.2 10*3/uL (ref 0.7–4.0)
MCH: 27.3 pg (ref 26.0–34.0)
MCHC: 34 g/dL (ref 30.0–36.0)
MCV: 80.4 fL (ref 78.0–100.0)
MONO ABS: 0.3 10*3/uL (ref 0.1–1.0)
Monocytes Relative: 6 %
NEUTROS ABS: 2.4 10*3/uL (ref 1.7–7.7)
Neutrophils Relative %: 47 %
Platelets: 219 10*3/uL (ref 150–400)
RBC: 5.56 MIL/uL (ref 4.22–5.81)
RDW: 12.8 % (ref 11.5–15.5)
WBC: 4.9 10*3/uL (ref 4.0–10.5)

## 2016-03-09 LAB — BASIC METABOLIC PANEL
ANION GAP: 9 (ref 5–15)
BUN: 19 mg/dL (ref 6–20)
CHLORIDE: 104 mmol/L (ref 101–111)
CO2: 26 mmol/L (ref 22–32)
Calcium: 9.9 mg/dL (ref 8.9–10.3)
Creatinine, Ser: 1.16 mg/dL (ref 0.61–1.24)
GFR calc Af Amer: >60 mL/min (ref >60)
GFR calc non Af Amer: >60 mL/min (ref >60)
GLUCOSE: 100 mg/dL — AB (ref 65–99)
POTASSIUM: 3.7 mmol/L (ref 3.5–5.1)
Sodium: 139 mmol/L (ref 135–145)

## 2016-03-09 MED ORDER — ONDANSETRON HCL 4 MG/2ML IJ SOLN
4.0000 mg | Freq: Once | INTRAMUSCULAR | Status: AC
  Administered 2016-03-09: 4 mg via INTRAVENOUS

## 2016-03-09 MED ORDER — ONDANSETRON HCL 4 MG/2ML IJ SOLN
INTRAMUSCULAR | Status: AC
  Administered 2016-03-09: 4 mg via INTRAVENOUS
  Filled 2016-03-09: qty 2
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
## 2016-07-27 ENCOUNTER — Ambulatory Visit (INDEPENDENT_AMBULATORY_CARE_PROVIDER_SITE_OTHER): Payer: Self-pay | Admitting: Family Medicine

## 2016-07-27 ENCOUNTER — Encounter: Payer: Self-pay | Admitting: Family Medicine

## 2016-07-27 VITALS — BP 137/77 | HR 67 | Temp 98.3°F | Resp 17 | Ht 67.0 in | Wt 209.0 lb

## 2016-07-27 DIAGNOSIS — Z024 Encounter for examination for driving license: Secondary | ICD-10-CM
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
## 2016-07-27 NOTE — Patient Instructions (Addendum)
  One year card with history of hypertension, although it appears controlled off of medications at this time. Follow-up with your primary care provider for blood pressure assessment and monitoring, as well as to discuss symptoms of numbness or tingling in fingers. If any worsening of those symptoms that affect your ability to sense the controls, or any weakness, do not operate motor vehicle.    IF you received an x-ray today, you will receive an invoice from [facility]. Please contact [facility] at [phone] with questions or concerns regarding your invoice.   IF you received labwork today, you will receive an invoice from [facility]. Please contact [facility] at [phone] with questions or concerns regarding your invoice.   Our billing staff will not be able to assist you with questions regarding bills from these companies.  You will be contacted with the lab results as soon as they are available. The fastest way to get your results is to activate your My Chart account. Instructions are located on the last page of this paperwork. If you have not heard from us regarding the results in 2 weeks, please contact this office.

## 2017-07-23 ENCOUNTER — Other Ambulatory Visit: Payer: Self-pay

## 2017-07-23 ENCOUNTER — Ambulatory Visit (INDEPENDENT_AMBULATORY_CARE_PROVIDER_SITE_OTHER): Payer: Self-pay | Admitting: Family Medicine

## 2017-07-23 ENCOUNTER — Encounter: Payer: Self-pay | Admitting: Family Medicine

## 2017-07-23 VITALS — BP 110/72 | HR 87 | Temp 97.7°F | Resp 18 | Ht 71.0 in | Wt 211.0 lb

## 2017-07-23 DIAGNOSIS — Z024 Encounter for examination for driving license: Secondary | ICD-10-CM

## 2017-07-23 NOTE — Progress Notes (Signed)
By signing my name below, I, Stann Ore, attest that this documentation has been prepared under the direction and in the presence of Meredith Staggers, MD. Electronically Signed: Stann Ore, Scribe. 07/23/2017 , 3:02 PM .  Patient was seen in Room 11 .  Patient ID: Gregory Mills, male   DOB: Jul 07, 1955, 62 y.o.   MRN: [id number] Chief Complaint  Patient presents with  . DOT    Commercial Driver Medical Examination  Gregory Mills is a 62 y.o. male who presents today for a commercial driver fitness determination physical exam. The patient reports no problems.   Review of Systems Pertinent items are noted in HPI.  Objective: He has a history of HTN. He was recently treated for a left ringer finger dislocation of IP joint, Dr. Melvyn Novas. Last DOT physical May 8th, 2018. At that visit, his BP was normal at 137/77 off medication. His last dose of lisinopril was about a year ago. He still checks his BP occasionally. He was seen by his PCP on Wednesday (3 days ago), and everything was normal. His BP has been controlled with diet and exercise. He denies any trouble with gripping in his left hand. He denies any numbness in his extremities. He denies history of sleep apnea, snoring, or daytime somnolence. He was seen by eye doctor on Wednesday as well, and showed some weakness in his left eye.   Vision: Visual Acuity in Right Eye - Without correction: 20/20   Visual Acuity in Left Eye - Without correction: 20/30   Visual Acuity in Both Eyes - Without correction: 20/15    Comments: Pt passed color test. Pt has 70 degree vision   Hearing Screening Comments: Pt passed whisper test.   Applicant can recognize and distinguish among traffic control signals and devices showing standard red, green, and amber colors.  Monocular Vision?: No  BP 110/72   Pulse 87   Temp 97.7 F (36.5 C) (Oral)   Resp 18   Ht  (1.803 m)   Wt 211 lb (95.7 kg)   SpO2 100%   BMI 29.43 kg/m   General  Appearance:    Alert, cooperative, no distress, appears stated age Head:    Normocephalic, without obvious abnormality, atraumatic Eyes:    PERRL, conjunctiva/corneas clear, EOM's intact, fundi    benign, both eyes      Ears:    Normal TM's and external ear canals, both ears Nose:   Nares normal, septum midline, mucosa normal, no drainage    or sinus tenderness Throat:   Lips, mucosa, and tongue normal; teeth and gums normal Neck:   Supple, symmetrical, trachea midline, no adenopathy;       thyroid:  No enlargement/tenderness/nodules; no carotid   bruit or JVD Back:     Symmetric, no curvature, ROM normal, no CVA tenderness Lungs:     Clear to auscultation bilaterally, respirations unlabored Chest wall:    No tenderness or deformity Heart:    Regular rate and rhythm, S1 and S2 normal, no murmur, rub   or gallop Abdomen:     Soft, non-tender, bowel sounds active all four quadrants,    no masses, no organomegaly Genitalia:    Normal male without lesion, discharge or tenderness; no hernia appreciated  Extremities:   Extremities normal, atraumatic, no cyanosis or edema Pulses:   2+ and symmetric all extremities Skin:   Skin color, texture, turgor normal, no rashes or lesions Lymph nodes:   Cervical, supraclavicular, and axillary nodes normal Neurologic:  CNII-XII intact. Normal strength, sensation and reflexes      throughout  Assessment:  Healthy male exam.  Meets standards, driver qualified for 2 years.    Plan:  Medical examiners certificate completed and printed. Return as needed.   Gregory Mills is a 62 y.o. male Encounter for commercial driver medical examination (CDME) History of hypertension, but resolved with change in diet and exercise.  Off medication for approximately year and 3 months at this time with normal readings at home as well as normal reading in the office.  Does not appear to have hypertension at this time.  No other concerns on exam or history.  2-year card  was provided, see paperwork.  No orders of the defined types were placed in this encounter.  Patient Instructions    As you have been off blood pressure medication over a year, I did complete your paperwork for a full 2-year card.  Continue to watch that blood pressure and if any elevations over 140/90, be seen by your primary care provider.  Thank you for coming in today.   IF you received an x-ray today, you will receive an invoice from [facility]. Please contact [facility] at [phone] with questions or concerns regarding your invoice.   IF you received labwork today, you will receive an invoice from [place]. Please contact [facility] at [phone] with questions or concerns regarding your invoice.   Our billing staff will not be able to assist you with questions regarding bills from these companies.  You will be contacted with the lab results as soon as they are available. The fastest way to get your results is to activate your My Chart account. Instructions are located on the last page of this paperwork. If you have not heard from [place] regarding the results in 2 weeks, please contact this office.      I personally performed the services described in this documentation, which was scribed in my presence. The recorded information has been reviewed and considered for accuracy and completeness, addended by me as needed, and agree with information above.  Signed,   Meredith Staggers, MD Primary Care at [facility].  07/23/17 4:16 PM

## 2017-07-23 NOTE — Patient Instructions (Addendum)
  As you have been off blood pressure medication over a year, I did complete your paperwork for a full 2-year card.  Continue to watch that blood pressure and if any elevations over 140/90, be seen by your primary care provider.  Thank you for coming in today.   IF you received an x-ray today, you will receive an invoice from [facility]. Please contact [facility] at [phone] with questions or concerns regarding your invoice.   IF you received labwork today, you will receive an invoice from [place]. Please contact [facility] at [phone] with questions or concerns regarding your invoice.   Our billing staff will not be able to assist you with questions regarding bills from these companies.  You will be contacted with the lab results as soon as they are available. The fastest way to get your results is to activate your My Chart account. Instructions are located on the last page of this paperwork. If you have not heard from [place] regarding the results in 2 weeks, please contact this office.

## 2024-04-22 DIAGNOSIS — K5900 Constipation, unspecified: Secondary | ICD-10-CM

## 2024-04-22 DIAGNOSIS — T650X1A Toxic effect of cyanides, accidental (unintentional), initial encounter: Secondary | ICD-10-CM

## 2024-04-22 DIAGNOSIS — N179 Acute kidney failure, unspecified: Secondary | ICD-10-CM

## 2024-04-22 DIAGNOSIS — X000XXA Exposure to flames in uncontrolled fire in building or structure, initial encounter: Secondary | ICD-10-CM

## 2024-04-22 DIAGNOSIS — S2249XA Multiple fractures of ribs, unspecified side, initial encounter for closed fracture: Secondary | ICD-10-CM

## 2024-04-22 DIAGNOSIS — E8729 Other acidosis: Secondary | ICD-10-CM

## 2024-04-22 DIAGNOSIS — R7401 Elevation of levels of liver transaminase levels: Secondary | ICD-10-CM

## 2024-04-22 DIAGNOSIS — T3121 Burns involving 20-29% of body surface with 10-19% third degree burns: Secondary | ICD-10-CM

## 2024-04-22 DIAGNOSIS — I469 Cardiac arrest, cause unspecified: Secondary | ICD-10-CM

## 2024-04-22 DIAGNOSIS — T5891XA Toxic effect of carbon monoxide from unspecified source, accidental (unintentional), initial encounter: Secondary | ICD-10-CM

## 2024-04-22 MED ADMIN — Lactated Ringer's Solution: 2000 mL | INTRAVENOUS | NDC 00264775000

## 2024-04-22 MED ADMIN — Sodium Chloride IV Soln 0.9%: 1000 mL | INTRAVENOUS | NDC 00338004902

## 2024-04-22 MED ADMIN — Sodium Bicarbonate IV Soln 8.4%: 50 meq | NDC 76329335201

## 2024-04-22 MED ADMIN — Phenylephrine HCl IV Soln 10 MG/ML: 80 ug | NDC 00641614225

## 2024-04-22 MED ADMIN — Phenylephrine-NaCl IV Solution 20 MG/250ML-0.9%: 40 ug/min | INTRAVENOUS | NDC 99999070041

## 2024-04-22 MED ADMIN — Phenylephrine-NaCl IV Solution 20 MG/250ML-0.9%: 400 ug/min | INTRAVENOUS | NDC 99999070041

## 2024-04-22 MED ADMIN — henylephrine-NaCl Pref Syr 0.8 MG/10ML-0.9% (80 MCG/ML): 80 ug | INTRAVENOUS | NDC 65302050510

## 2024-04-22 MED ADMIN — Hydroxocobalamin (Antidote) For IV Soln 5 GM: 5 g | INTRAVENOUS | NDC 50633031011

## 2024-04-22 MED ADMIN — Sodium Bicarbonate IV Soln 8.4%: 50 meq | INTRAVENOUS | NDC 76329335201

## 2024-04-22 MED ADMIN — Iohexol IV Soln 350 MG/ML: 75 mL | INTRAVENOUS | NDC 00407141490

## 2024-04-22 MED ADMIN — Tet Tox-Diph-Acell Pertuss Ad Inj 5-2-15.5 LF-LF-MCG/0.5ML: 0.5 mL | INTRAMUSCULAR | NDC 49281040089

## 2024-04-22 MED ADMIN — Cefazolin Sodium-Dextrose IV Solution 2 GM/100ML-4%: 2 g | INTRAVENOUS | NDC 00338350841

## 2024-04-22 MED ADMIN — Sodium Bicarbonate IV Soln 8.4%: 100 meq | INTRAVENOUS | NDC 76329335201

## 2024-04-22 MED ADMIN — Epinephrine PF Inj 1 MG/ML: 20 ug/min | INTRAVENOUS | NDC 99999070065

## 2024-04-22 MED ADMIN — METHYLENE BLUE IVPB: 180 mg | INTRAVENOUS | NDC 54288014701

## 2024-04-22 MED ADMIN — Norepinephrine-Dextrose IV Solution 4 MG/250ML-5%: 4 mg | NDC 00338011220

## 2024-04-22 MED ADMIN — vasopressin 20 units/100 mL infusion: 0.01 [IU]/min | INTRAVENOUS | NDC 81298855203

## 2024-04-23 ENCOUNTER — Encounter: Payer: Self-pay | Admitting: Family Medicine

## 2024-04-25 LAB — URINE DRUGS OF ABUSE SCREEN W ALC, ROUTINE (REF LAB)
Amphetamines, Urine: NEGATIVE ng/mL
Barbiturate, Ur: NEGATIVE ng/mL
Benzodiazepine Quant, Ur: NEGATIVE ng/mL
Cannabinoid Quant, Ur: NEGATIVE ng/mL
Cocaine (Metab.): NEGATIVE ng/mL
Creatinine, Urine: 14.9 mg/dL — ABNORMAL LOW (ref 20.0–300.0)
Ethanol U, Quan: NEGATIVE %
Methadone Screen, Urine: NEGATIVE ng/mL
Nitrite Urine, Quantitative: NEGATIVE ug/mL
OPIATE SCREEN URINE: NEGATIVE ng/mL
Phencyclidine, Ur: NEGATIVE ng/mL
Propoxyphene, Urine: NEGATIVE ng/mL
pH, Urine: 5.9 (ref 4.5–8.9)
